# Patient Record
Sex: Male | Born: 1955 | Race: White | Hispanic: No | Marital: Married | Smoking: Former smoker
Health system: Southern US, Community
[De-identification: ages and names within clinical notes are randomized; demographics above are authoritative.]

## PROBLEM LIST (undated history)

## (undated) DIAGNOSIS — R51 Headache: Secondary | ICD-10-CM

## (undated) DIAGNOSIS — K219 Gastro-esophageal reflux disease without esophagitis: Secondary | ICD-10-CM

## (undated) DIAGNOSIS — T4145XA Adverse effect of unspecified anesthetic, initial encounter: Secondary | ICD-10-CM

## (undated) DIAGNOSIS — T8859XA Other complications of anesthesia, initial encounter: Secondary | ICD-10-CM

## (undated) DIAGNOSIS — I10 Essential (primary) hypertension: Secondary | ICD-10-CM

## (undated) HISTORY — PX: HAND / FINGER LESION EXCISION: SUR531

## (undated) HISTORY — PX: APPENDECTOMY: SHX54

## (undated) HISTORY — PX: ELBOW FRACTURE SURGERY: SHX616

## (undated) HISTORY — PX: HAND NERVE REPAIR: SHX1728

## (undated) HISTORY — PX: HERNIA REPAIR: SHX51

## (undated) HISTORY — PX: COLON SURGERY: SHX602

---
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
## 2002-02-27 ENCOUNTER — Encounter: Payer: Self-pay | Admitting: Family Medicine

## 2002-02-27 ENCOUNTER — Encounter: Admission: RE | Admit: 2002-02-27 | Discharge: 2002-02-27 | Payer: Self-pay | Admitting: Family Medicine

## 2002-03-31 ENCOUNTER — Ambulatory Visit (HOSPITAL_COMMUNITY): Admission: RE | Admit: 2002-03-31 | Discharge: 2002-03-31 | Payer: Self-pay | Admitting: *Deleted

## 2004-05-12 ENCOUNTER — Ambulatory Visit (HOSPITAL_COMMUNITY): Admission: RE | Admit: 2004-05-12 | Discharge: 2004-05-12 | Payer: Self-pay | Admitting: *Deleted

## 2004-12-16 ENCOUNTER — Emergency Department (HOSPITAL_COMMUNITY): Admission: EM | Admit: 2004-12-16 | Discharge: 2004-12-16 | Payer: Self-pay | Admitting: *Deleted

## 2008-08-05 ENCOUNTER — Observation Stay (HOSPITAL_COMMUNITY): Admission: EM | Admit: 2008-08-05 | Discharge: 2008-08-13 | Payer: Self-pay | Admitting: Emergency Medicine

## 2008-08-07 ENCOUNTER — Encounter (INDEPENDENT_AMBULATORY_CARE_PROVIDER_SITE_OTHER): Payer: Self-pay | Admitting: Surgery

## 2009-05-11 ENCOUNTER — Ambulatory Visit (HOSPITAL_COMMUNITY): Admission: RE | Admit: 2009-05-11 | Discharge: 2009-05-12 | Payer: Self-pay | Admitting: Surgery

## 2010-10-18 LAB — CULTURE, BLOOD (ROUTINE X 2)
Culture: NO GROWTH
Culture: NO GROWTH

## 2010-10-18 LAB — CBC
HCT: 37.3 % — ABNORMAL LOW (ref 39.0–52.0)
HCT: 37.6 % — ABNORMAL LOW (ref 39.0–52.0)
HCT: 47.9 % (ref 39.0–52.0)
Hemoglobin: 12.5 g/dL — ABNORMAL LOW (ref 13.0–17.0)
Hemoglobin: 13 g/dL (ref 13.0–17.0)
Hemoglobin: 14.6 g/dL (ref 13.0–17.0)
Hemoglobin: 16.9 g/dL (ref 13.0–17.0)
MCHC: 34.8 g/dL (ref 30.0–36.0)
MCHC: 35 g/dL (ref 30.0–36.0)
MCHC: 35.1 g/dL (ref 30.0–36.0)
MCHC: 35.2 g/dL (ref 30.0–36.0)
MCHC: 35.3 g/dL (ref 30.0–36.0)
MCHC: 35.5 g/dL (ref 30.0–36.0)
MCV: 91.5 fL (ref 78.0–100.0)
MCV: 92.1 fL (ref 78.0–100.0)
MCV: 92.3 fL (ref 78.0–100.0)
MCV: 93.2 fL (ref 78.0–100.0)
Platelets: 243 10*3/uL (ref 150–400)
Platelets: 263 10*3/uL (ref 150–400)
Platelets: 272 10*3/uL (ref 150–400)
Platelets: 290 10*3/uL (ref 150–400)
RBC: 4.01 MIL/uL — ABNORMAL LOW (ref 4.22–5.81)
RBC: 4.08 MIL/uL — ABNORMAL LOW (ref 4.22–5.81)
RBC: 4.53 MIL/uL (ref 4.22–5.81)
RBC: 4.59 MIL/uL (ref 4.22–5.81)
RBC: 5.19 MIL/uL (ref 4.22–5.81)
RDW: 12.8 % (ref 11.5–15.5)
RDW: 12.9 % (ref 11.5–15.5)
RDW: 13 % (ref 11.5–15.5)
RDW: 13.2 % (ref 11.5–15.5)
RDW: 13.2 % (ref 11.5–15.5)
RDW: 13.4 % (ref 11.5–15.5)
WBC: 7.6 10*3/uL (ref 4.0–10.5)

## 2010-10-18 LAB — URINALYSIS, ROUTINE W REFLEX MICROSCOPIC
Glucose, UA: NEGATIVE mg/dL
Glucose, UA: NEGATIVE mg/dL
Hgb urine dipstick: NEGATIVE
Ketones, ur: NEGATIVE mg/dL
Leukocytes, UA: NEGATIVE
Nitrite: NEGATIVE
Protein, ur: NEGATIVE mg/dL
Protein, ur: NEGATIVE mg/dL
Specific Gravity, Urine: 1.041 — ABNORMAL HIGH (ref 1.005–1.030)
pH: 7 (ref 5.0–8.0)

## 2010-10-18 LAB — DIFFERENTIAL
Basophils Absolute: 0 10*3/uL (ref 0.0–0.1)
Basophils Relative: 0 % (ref 0–1)
Basophils Relative: 0 % (ref 0–1)
Basophils Relative: 0 % (ref 0–1)
Eosinophils Absolute: 0 10*3/uL (ref 0.0–0.7)
Eosinophils Absolute: 0.3 10*3/uL (ref 0.0–0.7)
Eosinophils Absolute: 0.3 10*3/uL (ref 0.0–0.7)
Eosinophils Relative: 4 % (ref 0–5)
Lymphocytes Relative: 12 % (ref 12–46)
Lymphocytes Relative: 18 % (ref 12–46)
Lymphocytes Relative: 27 % (ref 12–46)
Lymphs Abs: 1.1 10*3/uL (ref 0.7–4.0)
Lymphs Abs: 1.4 10*3/uL (ref 0.7–4.0)
Lymphs Abs: 2.4 10*3/uL (ref 0.7–4.0)
Lymphs Abs: 2.7 10*3/uL (ref 0.7–4.0)
Monocytes Absolute: 0.7 10*3/uL (ref 0.1–1.0)
Monocytes Absolute: 1 10*3/uL (ref 0.1–1.0)
Monocytes Absolute: 1.2 10*3/uL — ABNORMAL HIGH (ref 0.1–1.0)
Monocytes Absolute: 1.5 10*3/uL — ABNORMAL HIGH (ref 0.1–1.0)
Monocytes Relative: 12 % (ref 3–12)
Monocytes Relative: 12 % (ref 3–12)
Monocytes Relative: 5 % (ref 3–12)
Monocytes Relative: 9 % (ref 3–12)
Neutro Abs: 13.2 10*3/uL — ABNORMAL HIGH (ref 1.7–7.7)
Neutro Abs: 14.6 10*3/uL — ABNORMAL HIGH (ref 1.7–7.7)
Neutro Abs: 5.6 10*3/uL (ref 1.7–7.7)
Neutro Abs: 7.3 10*3/uL (ref 1.7–7.7)
Neutrophils Relative %: 58 % (ref 43–77)
Neutrophils Relative %: 66 % (ref 43–77)
Neutrophils Relative %: 75 % (ref 43–77)

## 2010-10-18 LAB — HEPATIC FUNCTION PANEL
AST: 19 U/L (ref 0–37)
Bilirubin, Direct: 0.2 mg/dL (ref 0.0–0.3)
Total Bilirubin: 1.2 mg/dL (ref 0.3–1.2)

## 2010-10-18 LAB — BASIC METABOLIC PANEL
BUN: 10 mg/dL (ref 6–23)
BUN: 7 mg/dL (ref 6–23)
BUN: 9 mg/dL (ref 6–23)
BUN: 9 mg/dL (ref 6–23)
CO2: 24 mEq/L (ref 19–32)
CO2: 26 mEq/L (ref 19–32)
CO2: 28 mEq/L (ref 19–32)
CO2: 28 mEq/L (ref 19–32)
Calcium: 8.4 mg/dL (ref 8.4–10.5)
Calcium: 8.5 mg/dL (ref 8.4–10.5)
Calcium: 8.6 mg/dL (ref 8.4–10.5)
Chloride: 104 mEq/L (ref 96–112)
Chloride: 106 mEq/L (ref 96–112)
Chloride: 107 mEq/L (ref 96–112)
Creatinine, Ser: 1.04 mg/dL (ref 0.4–1.5)
Creatinine, Ser: 1.06 mg/dL (ref 0.4–1.5)
Creatinine, Ser: 1.26 mg/dL (ref 0.4–1.5)
GFR calc Af Amer: >60 mL/min (ref >60)
GFR calc Af Amer: >60 mL/min (ref >60)
GFR calc Af Amer: >60 mL/min (ref >60)
GFR calc non Af Amer: >60 mL/min (ref >60)
GFR calc non Af Amer: >60 mL/min (ref >60)
GFR calc non Af Amer: >60 mL/min (ref >60)
Glucose, Bld: 130 mg/dL — ABNORMAL HIGH (ref 70–99)
Glucose, Bld: 132 mg/dL — ABNORMAL HIGH (ref 70–99)
Glucose, Bld: 91 mg/dL (ref 70–99)
Glucose, Bld: 98 mg/dL (ref 70–99)
Glucose, Bld: 99 mg/dL (ref 70–99)
Potassium: 3.7 mEq/L (ref 3.5–5.1)
Potassium: 3.7 mEq/L (ref 3.5–5.1)
Sodium: 136 mEq/L (ref 135–145)
Sodium: 137 mEq/L (ref 135–145)
Sodium: 142 mEq/L (ref 135–145)

## 2010-10-18 LAB — COMPREHENSIVE METABOLIC PANEL
ALT: 18 U/L (ref 0–53)
ALT: 29 U/L (ref 0–53)
ALT: 35 U/L (ref 0–53)
AST: 28 U/L (ref 0–37)
Albumin: 3.1 g/dL — ABNORMAL LOW (ref 3.5–5.2)
Alkaline Phosphatase: 44 U/L (ref 39–117)
BUN: 10 mg/dL (ref 6–23)
CO2: 25 mEq/L (ref 19–32)
CO2: 28 mEq/L (ref 19–32)
Calcium: 8.1 mg/dL — ABNORMAL LOW (ref 8.4–10.5)
Calcium: 9.1 mg/dL (ref 8.4–10.5)
Calcium: 9.5 mg/dL (ref 8.4–10.5)
Creatinine, Ser: 1.22 mg/dL (ref 0.4–1.5)
GFR calc Af Amer: >60 mL/min (ref >60)
GFR calc Af Amer: >60 mL/min (ref >60)
GFR calc non Af Amer: >60 mL/min (ref >60)
Glucose, Bld: 102 mg/dL — ABNORMAL HIGH (ref 70–99)
Potassium: 3.9 mEq/L (ref 3.5–5.1)
Potassium: 4.2 mEq/L (ref 3.5–5.1)
Sodium: 136 mEq/L (ref 135–145)
Sodium: 143 mEq/L (ref 135–145)
Total Protein: 5.4 g/dL — ABNORMAL LOW (ref 6.0–8.3)
Total Protein: 6 g/dL (ref 6.0–8.3)
Total Protein: 7.1 g/dL (ref 6.0–8.3)

## 2010-10-18 LAB — STOOL CULTURE

## 2010-10-18 LAB — URINE CULTURE
Colony Count: NO GROWTH
Colony Count: NO GROWTH
Culture: NO GROWTH

## 2010-10-18 LAB — APTT: aPTT: 31 seconds (ref 24–37)

## 2010-10-18 LAB — HEMOCCULT GUIAC POC 1CARD (OFFICE): Fecal Occult Bld: NEGATIVE

## 2010-10-18 LAB — PROTIME-INR
INR: 1 (ref 0.00–1.49)
Prothrombin Time: 13.6 seconds (ref 11.6–15.2)
Prothrombin Time: 14.2 seconds (ref 11.6–15.2)

## 2010-10-18 LAB — MAGNESIUM: Magnesium: 2 mg/dL (ref 1.5–2.5)

## 2010-10-18 LAB — VITAMIN B12: Vitamin B-12: 510 pg/mL (ref 211–911)

## 2010-10-18 LAB — FECAL LACTOFERRIN, QUANT

## 2010-10-18 LAB — URINE MICROSCOPIC-ADD ON

## 2010-11-15 NOTE — H&P (Signed)
NAME:  Ruben Lopez, KUENZI NO.:  [id number]   MEDICAL RECORD NO.:  [id number]          PATIENT TYPE:  EMS   LOCATION:  MAJO                         FACILITY:  [facility]   PHYSICIAN:  Michiel Cowboy, MDDATE OF BIRTH:  08-01-1955   DATE OF ADMISSION:  08/05/2008  DATE OF DISCHARGE:                              HISTORY & PHYSICAL   PRIMARY CARE [place]:  Dr. Dorothe Pea.   CHIEF COMPLAINT:  Abdominal distention and pain, constipation and now  some diarrhea.  The patient is a 55 year old gentleman with a history of  gout and fracture of his ankle in the past.  Otherwise unremarkable.  He  had an episode of mild nausea and decreased appetite for about a week or  so with one episode of diarrhea and then followed by about 7 days of  constipation.  He had not used the bathroom at all for about 4 days and  then after use of some MiraLax and enema finally had some bowel  movements and even today diarrhea x1.  The patient continued to have  abdominal discomfort and presented to charge and care clinic where they  obtained a KUB and there was concern about possible small bowel  obstruction.  Preliminary read no, official urology read was done.  The  patient was sent as a direct admit care to [facility]. He has no history  of small bowel obstructions in the past. In his family his wife and his  son and have had similar bouts of diarrhea, nausea and vomiting, which  he had probably the least.  He had no abdominal surgeries in the past.  The patient had been passing diarrhea today and had a lot of rumbling in  his abdomen.   PAST MEDICAL HISTORY:  Otherwise unremarkable.  Of note, the patient had  a negative colonoscopy 2 years ago and also has had in the past  esophageal dilatation but currently not endorsing any history of  dysphasia.   SOCIAL HISTORY:  Never smoked, except for when he was a teenager. Does  not drink alcohol except occasionally.  Lives at home, has a wife and  kids.   FAMILY HISTORY:  Noncontributory.   ALLERGIES:  No known drug allergies.   MEDICATIONS:  Allopurinol 300 mg daily.  Has not taken that for a few  days now.   PHYSICAL EXAMINATION:  VITAL SIGNS: Temperature 98.7, blood pressure  168/100, pulse 98, respirations 18, satting 97% on room air.  The  patient appears to be in no acute distress.  HEENT:  Head nontraumatic.  Somewhat dryish mucous membranes but normal  skin tone.  LUNGS:  Clear to auscultation bilaterally.  Heart: Regular rate and rhythm.  No murmurs, rubs, gallops.  ABDOMEN: Slight epigastric tenderness, soft.  There are positive bowel  sounds. Round. There is slight generalized tenderness but otherwise  unremarkable.  LOWER EXTREMITIES: Without clubbing, cyanosis or edema.   REVIEW OF SYSTEMS:  Negative except for his HPI.  No fevers or chills.  No chest pain. No shortness of breath.  No blood in stool.   STUDIES:  No blood work  obtained, but a KUB when reviewed by radiologist  they felt there was nonspecific pattern. I did not think there was any  small bowel obstruction but recommended repeating imaging in the a.m.  and meanwhile observe the patient clinically.   ASSESSMENT/PLAN:  This is a 55 year old gentleman with constipation,  diarrhea, abdominal tenderness, question mild ileus but no evidence of  small bowel obstruction now.   1. Abdominal pain.  The patient had a recent gastroenteritis for which      he is now recovering. He had a bout of constipation after his      gastroenteritis which he self-treated at home and reports today he      had one evidence of episode of diarrhea. He is still having      somewhat diminished appetite and abdominal tenderness and pain.      Will observe overnight.  Check basic labs, CBC, CMP, check lipase.      Since he has some epigastric tenderness we will make sure he is on      Protonix. Since the patient actually had diarrhea will hold off on      cathartics at this  point and just see which way the patient trends      and how many bowel movements he will continue to have. Will repeat      KUB in the morning and follow him clinically. If diarrhea persists,      will send it for stool studies.  Give mild IV fluids for mild      dehydration.  2. History of gout.  Hold allopurinol for right now. We will give an      chips and bowel rest.  3. Prophylaxis. Protonix plus Lovenox.      Michiel Cowboy, MD  Electronically Signed     AVD/MEDQ  D:  08/05/2008  T:  08/06/2008  Job:  [id number]   cc:   Jethro Bastos, M.D.

## 2010-11-15 NOTE — Op Note (Signed)
NAME:  SAED, HUDLOW NO.:  [id number]   MEDICAL RECORD NO.:  [id number]          PATIENT TYPE:  OBV   LOCATION:  5123                         FACILITY:  [facility]   PHYSICIAN:  Thornton Park. Daphine Deutscher, MD  DATE OF BIRTH:  06-Dec-1955   DATE OF PROCEDURE:  08/07/2008  DATE OF DISCHARGE:                               OPERATIVE REPORT   PREOPERATIVE DIAGNOSIS:  A 55 year old man consulted this morning with  acute abdomen with exquisite right-sided abdominal pain and a CT scan  showing evidence of a cecal bascule.   PROCEDURE:  Exploratory laparotomy, decompression of the cecum through  the appendix with appendectomy, and cecopexy.   SURGEON:  Thornton Park. Daphine Deutscher, MD   ASSISTANT:  Letha Cape, PA   DESCRIPTION OF PROCEDURE:  Mr. Cain is a 55 year old man who was taken  to OR #17 on Friday, August 07, 2008, and given general anesthesia.  The abdomen was prepped with a Techni-Care substitute and draped  sterilely.  Longitudinal small incision was initially made and had  enlarged because of the massive distention.  Upon making a laparotomy  incision going to the right of the umbilicus, I found the cecum to the  midline and was massively enlarged.  In order to identify the anatomy, I  went ahead and found the terminal ileum and ran distally to where it  came into the cecum.  Then I made a realize that the cecum was twisted,  rotated up, and was likely obstructed near the midportion of the  ascending colon partially that was quite distended.  I elected to go  ahead and mobilize the right colon more and was able then to free the  appendix from the cecum.  I entered it using the appendix as a conduit  to get into the cecum and inserted a 16-gauge Angiocath through which I  was able to decompress the gaseous distention of the cecum and  transverse colon.  I then stapled across the appendix doing an  appendectomy and sending that specimen for permanent sections.  With the  cecum markedly deflated and unmanageable, I was then able to pex it in  the right lower quadrant without difficulty with a 3-0 Vicryl.  In  addition, the appendectomy stump was oriented to the peritoneal surface  laterally inferiorly in the abdominal cavity.  This seemed to straighten  out the cecum.  Bleeding was basically controlled.  I had used a  harmonic scalpel when mobilizing portion of the hepatic flexure and  trying to straighten out the ascending colon.  This was inspected.  No  other bleeding was noted.  The midline was closed with running double-  stranded PDS from either end and tied the middle.  The wound was  irrigated and closed with staples as well as some nylon sutures around  the umbilicus.  The patient seemed to tolerate the procedure and was  taken to recovery room in satisfactory condition.      Thornton Park Daphine Deutscher, MD  Electronically Signed     MBM/MEDQ  D:  08/07/2008  T:  08/08/2008  Job:  [id number]

## 2010-11-15 NOTE — Discharge Summary (Signed)
NAME:  Ruben Lopez, Ruben Lopez              ACCOUNT NO.:  [id number]   MEDICAL RECORD NO.:  [id number]          PATIENT TYPE:  OBV   LOCATION:  5153                         FACILITY:  [facility]   PHYSICIAN:  Kela Millin, M.D.DATE OF BIRTH:  11-Aug-1955   DATE OF ADMISSION:  08/05/2008  DATE OF DISCHARGE:  08/13/2008                               DISCHARGE SUMMARY   DISCHARGE DIAGNOSES:  1. Cecal volvulus, partial.  2. Volume depletion - resolved.  3. Hypokalemia - potassium replaced.  4. History of gout.   PROCEDURES AND STUDIES:  1. CT scan of abdomen and pelvis on August 06, 2008 - dilated,      abnormally positioned cecum which is more anterior and extending to      the mid line.  Slight twisting of the right lower quadrant      mesentery and terminal ileum without associated bowel obstruction -      cecal bascule or nonobstructing partial cecal volvulus.  2. Exploratory laparotomy - with decompression of the cecum via      appendix, appendectomy, and cecopexy per Dr. Daphine Deutscher.   CONSULTATIONS:  [facility].   BRIEF HISTORY:  The patient is a 55 year old white male with the above-  listed medical problems who presented with abdominal pain.  Had some  constipation, and then began having some diarrhea.  He reported that he  had had mild nausea and a decreased appetite for about a week or so, and  had 1 episode of diarrhea followed by 7 days of constipation.  He  reported that he had not had any bowel movement at all for 4 days, but  after he used MiraLax and an enema he began having bowel movements and  subsequently diarrhea.  He continued to have abdominal discomfort, and  so he went to the clinic where a KUB was obtained and there was concern  about possible small bowel obstruction.  He was directly admitted to the  hospital for further evaluation and management.   Please see the full admission history and physical dictated on August 05, 2008 by Dr. Adela Glimpse for the details of  the admission physical exam as  well as the laboratory data.   HOSPITAL COURSE:  1. Cecal volvulus, partial - upon admission the patient had abdominal      x-rays done which revealed no acute findings - nonobstructive bowel      gas pattern.  He received enemas, and by the next hospital day he      reported that he still was feeling constipated with some abdominal      discomfort.  A CT scan of his abdomen and pelvis was ordered, and      the results as stated above.  The patient was found to have an      acute abdomen, and [facility] was consulted and they saw the      patient and had exploratory laparotomy on August 07, 2008 with      decompression of the cecum via the appendix.  He also had an      appendectomy and a cecopexy.  The patient had leukocytosis, and      workup was, otherwise, negative for infectious source.  He was      empirically placed on IV antibiotics.  Following the surgery he      gradually began to improve, and was started on clear liquids which      he tolerated well.  He was mobilized and began having bowel      movements, and his diet was advanced to solids which he has      tolerated well.  Surgery subsequently discontinued the antibiotics.      The patient has continued to improve, and leukocytosis has      resolved.  He was seen on rounds by surgery today, and they      recommend that he be discharged home and they agree.  He is to      follow up with surgery on Tuesday for staple removal, and then see      Dr. Daphine Deutscher 2 weeks after the staples have been removed.  He is also      to follow up with his primary care physician following discharge in      about 2 weeks.  2. History of gout - the patient is to continue his allopurinol upon      discharge.  3. Hypokalemia - his potassium was replaced during his hospital stay.   DISCHARGE MEDICATIONS:  1. Percocet 5/325 mg 1-2 tablets p.o. q.4 hours p.r.n. pain.  2. Allopurinol 300 mg p.o. daily as  previously.  3. Stool softener p.r.n. constipation.  4. Zyrtec-D 1 p.o. daily as previously.   FOLLOW-UP CARE:  1. To [facility] on August 18, 2008 at 3:30 p.m. for staple      removal.  2. Dr. Dorothe Pea in 1-2 weeks.  3. Dr. Daphine Deutscher 2 weeks after staple removal as above.   DISCHARGE CONDITION:  Improved/stable.      Kela Millin, M.D.  Electronically Signed     ACV/MEDQ  D:  08/13/2008  T:  08/13/2008  Job:  [id number]   cc:   Jethro Bastos, M.D.  Daphine Deutscher, M.D.

## 2010-11-15 NOTE — Consult Note (Signed)
NAME:  Ruben Lopez, SHILLINGFORD NO.:  [id number]   MEDICAL RECORD NO.:  [id number]          PATIENT TYPE:  OBV   LOCATION:  5153                         FACILITY:  [facility]   PHYSICIAN:  Thornton Park. Daphine Deutscher, MD  DATE OF BIRTH:  1956/01/07   DATE OF CONSULTATION:  08/07/2008  DATE OF DISCHARGE:                                 CONSULTATION   TIME OF CONSULTATION:  0905 a.m.   REQUESTING PHYSICIAN:  Ramiro Harvest, MD, [facility].   CONSULTING SURGEON:  Thornton Park. Daphine Deutscher, MD.   REASON FOR CONSULTATION:  Abdominal pain, question cecal volvulus.   HISTORY OF PRESENT ILLNESS:  [name] is a 55 year old, otherwise,  healthy male who had apparently several days of diarrhea at home and was  admitted to the hospital due to his diarrhea.  At this time, they felt  that he had gastroenteritis.  However, once admitted the patient was  complaining of not having adequate bowel movements and so at this time  he was felt to be constipated and lactulose as well as enemas were  given.  Apparently, the patient was improving until last night the  patient had an acute onset of severe abdominal pain at approximately  0200 a.m.  A CT scan of the abdomen and pelvis was done, which showed  questionable partial cecal volvulus with twisting of the right lower  quadrant mesentery.  At this time, the patient has no longer passed any  flatus, had any bowel movement and has had several waves of nausea.  At  this time, he is complaining of severe abdominal pain with distention.  At this time, we were consulted due to an acute abdomen.   REVIEW OF SYSTEMS:  Please see HPI, otherwise, currently all other  systems are negative.   PAST MEDICAL HISTORY:  Gout.   PAST SURGICAL HISTORY:  No abdominal surgeries.   SOCIAL HISTORY:  The patient is married.  Currently, his sister, wife,  and father are in the room.  He does not smoke tobacco or drink alcohol.   ALLERGIES:  NKDA.   MEDICATIONS:   At home include,  1. Allopurinol 300 mg daily.  2. Zyrtec-D 1 tablet daily.   PHYSICAL EXAMINATION:  GENERAL:  Ruben Lopez is a 55 year old well-  developed, well-nourished white male who was currently lying in bed, in  severe distress.  VITAL SIGNS:  Temperature 98.1, blood pressure 162/82, pulse 64, and  respirations 18.  HEENT:  Head is normocephalic, atraumatic.  Sclerae noninjected.  Pupils  are equal, round, and reactive to light.  Ears and nose without any  obvious masses or lesions.  No rhinorrhea.  Mouth is pink and moist.  Throat shows no exudate.  NECK:  Supple.  Trachea is midline.  No thyromegaly.  HEART:  Regular rate and rhythm.  Normal S1 and S2.  No murmurs,  gallops, or rubs are noted.  A +2 carotid, radial, and pedal pulses  bilaterally.  LUNGS:  Clear to auscultation bilaterally.  No wheezes, rhonchi, or  rales noted.  Respiratory effort is nonlabored.  ABDOMEN:  Acute.  He is  very distended and tight.  He does have positive  peritoneal signs with extreme tenderness even with just the use of my  stethoscope.  At this time, he does not have any bowel sounds.  He does  not have any rebounding.  He does not have any scars, masses, or hernias  noted.  MUSCULOSKELETAL:  All 4 extremities are symmetrical with no cyanosis,  clubbing, or edema.  NEUROLOGIC:  Cranial nerves II through XII appeared to be grossly  intact.  PSYCH: The patient is alert and oriented x3 with an appropriate affect.   LABORATORIES AND DIAGNOSTICS:  White blood cell count is 14,600, which  is up from 7900, hemoglobin 14.6, hematocrit 41.4, platelet count is  296,000 with 90% neutrophil count.  PT is 1.1, lactic acid is 1.1.  Sodium 139, potassium 3.7, BUN 9, and creatinine 1.16.  CT scan of the  abdomen and pelvis revealed dilated abnormally positioned cecum, which  is more anterior and extending to the midline with slight twisting of  the right lower quadrant mesentery and terminal ileum without  associated  bowel obstruction.  Differential includes cecal bascule or  nonobstructing partial cecal volvulus.   IMPRESSION:  1. Acute abdomen, possible cecal volvulus.  2. History of gout.  3. Leukocytosis.   PLAN:  At this time, the patient needs emergent surgery.  Therefore, the  patient will be continued on n.p.o. and Unasyn will be started.  Consent  has been obtained.  Risks and benefits of the surgery have been  explained to the patient as well as the family and all are in agreement  with proceeding with an exploratory laparotomy.  Dr. Daphine Deutscher has assessed  the patient and agrees with the plan.      Letha Cape, PA      Thornton Park Daphine Deutscher, MD  Electronically Signed    KEO/MEDQ  D:  08/07/2008  T:  08/07/2008  Job:  [id number]   cc:   Ramiro Harvest, MD

## 2010-11-30 ENCOUNTER — Ambulatory Visit: Payer: 59 | Attending: Family Medicine

## 2010-11-30 DIAGNOSIS — IMO0001 Reserved for inherently not codable concepts without codable children: Secondary | ICD-10-CM | POA: Insufficient documentation

## 2010-11-30 DIAGNOSIS — M546 Pain in thoracic spine: Secondary | ICD-10-CM | POA: Insufficient documentation

## 2010-11-30 DIAGNOSIS — R293 Abnormal posture: Secondary | ICD-10-CM | POA: Insufficient documentation

## 2010-11-30 DIAGNOSIS — M542 Cervicalgia: Secondary | ICD-10-CM | POA: Insufficient documentation

## 2010-12-05 ENCOUNTER — Ambulatory Visit: Payer: 59 | Attending: Family Medicine

## 2010-12-05 DIAGNOSIS — M542 Cervicalgia: Secondary | ICD-10-CM | POA: Insufficient documentation

## 2010-12-05 DIAGNOSIS — IMO0001 Reserved for inherently not codable concepts without codable children: Secondary | ICD-10-CM | POA: Insufficient documentation

## 2010-12-05 DIAGNOSIS — R293 Abnormal posture: Secondary | ICD-10-CM | POA: Insufficient documentation

## 2010-12-05 DIAGNOSIS — M546 Pain in thoracic spine: Secondary | ICD-10-CM | POA: Insufficient documentation

## 2011-06-06 ENCOUNTER — Encounter (HOSPITAL_COMMUNITY): Payer: Self-pay | Admitting: Gastroenterology

## 2011-06-06 ENCOUNTER — Other Ambulatory Visit: Payer: Self-pay | Admitting: Gastroenterology

## 2011-06-06 ENCOUNTER — Ambulatory Visit (HOSPITAL_COMMUNITY)
Admission: RE | Admit: 2011-06-06 | Discharge: 2011-06-06 | Disposition: A | Discharge status: Home / Self Care | Payer: 59 | Source: Ambulatory Visit | Attending: Gastroenterology | Admitting: Gastroenterology

## 2011-06-06 ENCOUNTER — Encounter (HOSPITAL_COMMUNITY)
Admission: RE | Disposition: A | Discharge status: Home / Self Care | Payer: Self-pay | Source: Ambulatory Visit | Attending: Gastroenterology

## 2011-06-06 DIAGNOSIS — M109 Gout, unspecified: Secondary | ICD-10-CM | POA: Insufficient documentation

## 2011-06-06 DIAGNOSIS — K222 Esophageal obstruction: Secondary | ICD-10-CM | POA: Insufficient documentation

## 2011-06-06 DIAGNOSIS — K219 Gastro-esophageal reflux disease without esophagitis: Secondary | ICD-10-CM | POA: Insufficient documentation

## 2011-06-06 DIAGNOSIS — R1310 Dysphagia, unspecified: Secondary | ICD-10-CM | POA: Insufficient documentation

## 2011-06-06 HISTORY — PX: BALLOON DILATION: SHX5330

## 2011-06-06 HISTORY — PX: ESOPHAGOGASTRODUODENOSCOPY: SHX5428

## 2011-06-06 HISTORY — DX: Headache: R51

## 2011-06-06 HISTORY — DX: Gastro-esophageal reflux disease without esophagitis: K21.9

## 2011-06-06 SURGERY — EGD (ESOPHAGOGASTRODUODENOSCOPY)
Anesthesia: Moderate Sedation

## 2011-06-06 MED ORDER — SODIUM CHLORIDE 0.9 % IV SOLN
Freq: Once | INTRAVENOUS | Status: AC
  Administered 2011-06-06: 500 mL via INTRAVENOUS

## 2011-06-06 MED ORDER — MIDAZOLAM HCL 10 MG/2ML IJ SOLN
INTRAMUSCULAR | Status: AC
  Filled 2011-06-06: qty 2

## 2011-06-06 MED ORDER — DIPHENHYDRAMINE HCL 50 MG/ML IJ SOLN
INTRAMUSCULAR | Status: AC
  Filled 2011-06-06: qty 1

## 2011-06-06 MED ORDER — FENTANYL CITRATE 0.05 MG/ML IJ SOLN
INTRAMUSCULAR | Status: AC
  Filled 2011-06-06: qty 2

## 2011-06-06 MED ORDER — BUTAMBEN-TETRACAINE-BENZOCAINE 2-2-14 % EX AERO
INHALATION_SPRAY | CUTANEOUS | Status: DC | PRN
  Administered 2011-06-06: 2 via TOPICAL

## 2011-06-06 MED ORDER — DIPHENHYDRAMINE HCL 50 MG/ML IJ SOLN
INTRAMUSCULAR | Status: DC | PRN
  Administered 2011-06-06: 25 mg via INTRAVENOUS

## 2011-06-06 MED ORDER — MIDAZOLAM HCL 10 MG/2ML IJ SOLN
INTRAMUSCULAR | Status: DC | PRN
  Administered 2011-06-06 (×3): 2 mg via INTRAVENOUS
  Administered 2011-06-06: 1 mg via INTRAVENOUS

## 2011-06-06 MED ORDER — FENTANYL NICU IV SYRINGE 50 MCG/ML
INJECTION | INTRAMUSCULAR | Status: DC | PRN
  Administered 2011-06-06 (×2): 25 ug via INTRAVENOUS

## 2011-06-06 NOTE — H&P (Signed)
  Patient seen and examined and evaluated in preop no new changes or complaints from office visit last week please see scanned report for details

## 2011-06-07 ENCOUNTER — Encounter (HOSPITAL_COMMUNITY): Payer: Self-pay

## 2011-06-07 ENCOUNTER — Encounter (HOSPITAL_COMMUNITY): Payer: Self-pay | Admitting: Gastroenterology

## 2013-09-11 ENCOUNTER — Ambulatory Visit
Admission: RE | Admit: 2013-09-11 | Discharge: 2013-09-11 | Disposition: A | Discharge status: Home / Self Care | Payer: 59 | Source: Ambulatory Visit | Attending: Gastroenterology | Admitting: Gastroenterology

## 2013-09-11 ENCOUNTER — Other Ambulatory Visit: Payer: Self-pay | Admitting: Gastroenterology

## 2013-09-11 DIAGNOSIS — R109 Unspecified abdominal pain: Secondary | ICD-10-CM

## 2018-08-26 ENCOUNTER — Other Ambulatory Visit: Payer: Self-pay | Admitting: Family Medicine

## 2018-08-26 DIAGNOSIS — M5410 Radiculopathy, site unspecified: Secondary | ICD-10-CM

## 2018-09-11 ENCOUNTER — Other Ambulatory Visit: Payer: Self-pay | Admitting: Orthopedic Surgery

## 2018-09-11 DIAGNOSIS — M5416 Radiculopathy, lumbar region: Secondary | ICD-10-CM

## 2018-09-19 ENCOUNTER — Other Ambulatory Visit: Payer: Self-pay

## 2018-10-22 ENCOUNTER — Other Ambulatory Visit: Payer: Self-pay

## 2018-11-19 ENCOUNTER — Ambulatory Visit
Admission: RE | Admit: 2018-11-19 | Discharge: 2018-11-19 | Disposition: A | Discharge status: Home / Self Care | Payer: 59 | Source: Ambulatory Visit | Attending: Orthopedic Surgery | Admitting: Orthopedic Surgery

## 2018-11-19 ENCOUNTER — Other Ambulatory Visit: Payer: Self-pay

## 2018-11-19 DIAGNOSIS — M5416 Radiculopathy, lumbar region: Secondary | ICD-10-CM

## 2019-05-13 ENCOUNTER — Other Ambulatory Visit: Payer: Self-pay | Admitting: Physical Medicine and Rehabilitation

## 2019-05-13 DIAGNOSIS — M5416 Radiculopathy, lumbar region: Secondary | ICD-10-CM

## 2019-05-26 ENCOUNTER — Ambulatory Visit
Admission: RE | Admit: 2019-05-26 | Discharge: 2019-05-26 | Disposition: A | Discharge status: Home / Self Care | Payer: 59 | Source: Ambulatory Visit | Attending: Physical Medicine and Rehabilitation | Admitting: Physical Medicine and Rehabilitation

## 2019-05-26 ENCOUNTER — Other Ambulatory Visit: Payer: Self-pay

## 2019-05-26 DIAGNOSIS — M5416 Radiculopathy, lumbar region: Secondary | ICD-10-CM

## 2019-05-26 MED ORDER — METHYLPREDNISOLONE ACETATE 40 MG/ML INJ SUSP (RADIOLOG
40.0000 mg | Freq: Once | INTRAMUSCULAR | Status: AC
Start: 2019-05-26 — End: 2019-05-26
  Administered 2019-05-26: 09:00:00 40 mg via INTRA_ARTICULAR

## 2019-06-20 ENCOUNTER — Other Ambulatory Visit: Payer: Self-pay | Admitting: Orthopedic Surgery

## 2019-06-25 NOTE — Progress Notes (Signed)
Attempted to contact patient to schedule pre-procedure COVID appointment, no answer, left message for patient to return call   

## 2019-07-07 ENCOUNTER — Other Ambulatory Visit (HOSPITAL_COMMUNITY)
Admission: RE | Admit: 2019-07-07 | Discharge: 2019-07-07 | Disposition: A | Discharge status: Home / Self Care | Payer: No Typology Code available for payment source | Source: Ambulatory Visit | Attending: Orthopedic Surgery | Admitting: Orthopedic Surgery

## 2019-07-07 DIAGNOSIS — Z01812 Encounter for preprocedural laboratory examination: Secondary | ICD-10-CM | POA: Insufficient documentation

## 2019-07-07 DIAGNOSIS — Z20822 Contact with and (suspected) exposure to covid-19: Secondary | ICD-10-CM | POA: Diagnosis not present

## 2019-07-07 NOTE — Progress Notes (Signed)
[id number] - [place], [place] [address] [place] [address] Phone: [phone] Fax: [phone]    Your procedure is scheduled on Thursday, January 7th.  Report to [facility] Main Entrance "A" at 5:30 A.M., and check in at the Admitting office.  Call this number if you have problems the morning of surgery:  [phone]  Call [phone] if you have any questions prior to your surgery date Monday-Friday 8am-4pm   Remember:  Do not eat after midnight the night before your surgery (Wednesday 07/09/2019)  You may drink clear liquids until 4:30 the morning of your surgery.   Clear liquids allowed are: Water, Non-Citrus Juices (without pulp), Carbonated Beverages, Clear Tea, Black Coffee Only, and Gatorade   Please complete your PRE-SURGERY ENSURE that was provided to you by 4:30 A.M. the morning of surgery. Please, if able, drink it in one setting. DO NOT SIP.   Take these medicines the morning of surgery with A SIP OF WATER   If needed - FLOVENT HFA/inhaler, traMADol (ULTRAM)   As of today, STOP taking any Aspirin (unless otherwise instructed by your surgeon), Aleve, Naproxen, Ibuprofen, Motrin, Advil, Goody's, BC's, all herbal medications, fish oil, and all vitamins.  The Morning of Surgery  Do not wear jewelry..  Do not wear lotions, powders, colognes, or deodorant  Do not shave 48 hours prior to surgery.  Men may shave face and neck.  Do not bring valuables to the hospital.  [facility] is not responsible for any belongings or valuables.  If you are a smoker, DO NOT Smoke 24 hours prior to surgery  If you wear a CPAP at night please bring your mask, tubing, and machine the morning of surgery   Remember that you must have someone to transport you home after your surgery, and remain with you for 24 hours if you are discharged the same day.  Please bring cases for contacts, glasses, hearing aids,  dentures or bridgework because it cannot be worn into surgery.   Leave your suitcase in the car.  After surgery it may be brought to your room.  For patients admitted to the hospital, discharge time will be determined by your treatment team.  Patients discharged the day of surgery will not be allowed to drive home.   Special instructions:   Ruben Lopez- Preparing For Surgery  Before surgery, you can play an important role. Because skin is not sterile, your skin needs to be as free of germs as possible. You can reduce the number of germs on your skin by washing with CHG (chlorahexidine gluconate) Soap before surgery.  CHG is an antiseptic cleaner which kills germs and bonds with the skin to continue killing germs even after washing.    Oral Hygiene is also important to reduce your risk of infection.  Remember - BRUSH YOUR TEETH THE MORNING OF SURGERY WITH YOUR REGULAR TOOTHPASTE  Please do not use if you have an allergy to CHG or antibacterial soaps. If your skin becomes reddened/irritated stop using the CHG.  Do not shave (including legs and underarms) for at least 48 hours prior to first CHG shower. It is OK to shave your face.  Please follow these instructions carefully.   1. Shower the NIGHT BEFORE SURGERY and the MORNING OF SURGERY with CHG Soap.   2. If you chose to wash your hair, wash your hair first as usual with your normal shampoo.  3. After you shampoo, rinse  your hair and body thoroughly to remove the shampoo.  4. Use CHG as you would any other liquid soap. You can apply CHG directly to the skin and wash gently with a scrungie or a clean washcloth.   5. Apply the CHG Soap to your body ONLY FROM THE NECK DOWN.  Do not use on open wounds or open sores. Avoid contact with your eyes, ears, mouth and genitals (private parts). Wash Face and genitals (private parts)  with your normal soap.   6. Wash thoroughly, paying special attention to the area where your surgery will be  performed.  7. Thoroughly rinse your body with warm water from the neck down.  8. DO NOT shower/wash with your normal soap after using and rinsing off the CHG Soap.  9. Pat yourself dry with a CLEAN TOWEL.  10. Wear CLEAN PAJAMAS to bed the night before surgery, wear comfortable clothes the morning of surgery  11. Place CLEAN SHEETS on your bed the night of your first shower and DO NOT SLEEP WITH PETS.  Day of Surgery: Please shower the morning of surgery with the CHG soap Do not apply any deodorants/lotions. Please wear clean clothes to the hospital/surgery center.   Remember to brush your teeth WITH YOUR REGULAR TOOTHPASTE.  Please read over the following fact sheets that you were given.

## 2019-07-08 ENCOUNTER — Other Ambulatory Visit: Payer: Self-pay

## 2019-07-08 ENCOUNTER — Encounter (HOSPITAL_COMMUNITY)
Admission: RE | Admit: 2019-07-08 | Discharge: 2019-07-08 | Disposition: A | Discharge status: Home / Self Care | Payer: No Typology Code available for payment source | Source: Ambulatory Visit | Attending: Orthopedic Surgery | Admitting: Orthopedic Surgery

## 2019-07-08 ENCOUNTER — Encounter (HOSPITAL_COMMUNITY): Payer: Self-pay

## 2019-07-08 DIAGNOSIS — Z01818 Encounter for other preprocedural examination: Secondary | ICD-10-CM | POA: Diagnosis present

## 2019-07-08 HISTORY — DX: Other complications of anesthesia, initial encounter: T88.59XA

## 2019-07-08 HISTORY — DX: Essential (primary) hypertension: I10

## 2019-07-08 LAB — CBC WITH DIFFERENTIAL/PLATELET
Abs Immature Granulocytes: 0.24 10*3/uL — ABNORMAL HIGH (ref 0.00–0.07)
Basophils Absolute: 0.1 10*3/uL (ref 0.0–0.1)
Basophils Relative: 2 %
Eosinophils Absolute: 0.3 10*3/uL (ref 0.0–0.5)
Eosinophils Relative: 4 %
HCT: 48.5 % (ref 39.0–52.0)
Hemoglobin: 17 g/dL (ref 13.0–17.0)
Immature Granulocytes: 3 %
Lymphocytes Relative: 27 %
Lymphs Abs: 2.2 10*3/uL (ref 0.7–4.0)
MCH: 32.3 pg (ref 26.0–34.0)
MCHC: 35.1 g/dL (ref 30.0–36.0)
MCV: 92.2 fL (ref 80.0–100.0)
Monocytes Absolute: 1.3 10*3/uL — ABNORMAL HIGH (ref 0.1–1.0)
Monocytes Relative: 16 %
Neutro Abs: 4.1 10*3/uL (ref 1.7–7.7)
Neutrophils Relative %: 48 %
Platelets: 304 10*3/uL (ref 150–400)
RBC: 5.26 MIL/uL (ref 4.22–5.81)
RDW: 12.8 % (ref 11.5–15.5)
WBC: 8.4 10*3/uL (ref 4.0–10.5)
nRBC: 0 % (ref 0.0–0.2)

## 2019-07-08 LAB — TYPE AND SCREEN
ABO/RH(D): O POS
Antibody Screen: NEGATIVE

## 2019-07-08 LAB — APTT: aPTT: 33 seconds (ref 24–36)

## 2019-07-08 LAB — URINALYSIS, ROUTINE W REFLEX MICROSCOPIC
Bilirubin Urine: NEGATIVE
Glucose, UA: NEGATIVE mg/dL
Hgb urine dipstick: NEGATIVE
Ketones, ur: NEGATIVE mg/dL
Leukocytes,Ua: NEGATIVE
Nitrite: NEGATIVE
Protein, ur: NEGATIVE mg/dL
Specific Gravity, Urine: 1.014 (ref 1.005–1.030)
pH: 7 (ref 5.0–8.0)

## 2019-07-08 LAB — COMPREHENSIVE METABOLIC PANEL
ALT: 17 U/L (ref 0–44)
AST: 21 U/L (ref 15–41)
Albumin: 4 g/dL (ref 3.5–5.0)
Alkaline Phosphatase: 50 U/L (ref 38–126)
Anion gap: 11 (ref 5–15)
BUN: 17 mg/dL (ref 8–23)
CO2: 26 mmol/L (ref 22–32)
Calcium: 9.2 mg/dL (ref 8.9–10.3)
Chloride: 99 mmol/L (ref 98–111)
Creatinine, Ser: 1.3 mg/dL — ABNORMAL HIGH (ref 0.61–1.24)
GFR calc Af Amer: >60 mL/min (ref >60)
GFR calc non Af Amer: 58 mL/min — ABNORMAL LOW (ref >60)
Glucose, Bld: 108 mg/dL — ABNORMAL HIGH (ref 70–99)
Potassium: 4.1 mmol/L (ref 3.5–5.1)
Sodium: 136 mmol/L (ref 135–145)
Total Bilirubin: 0.8 mg/dL (ref 0.3–1.2)
Total Protein: 6.9 g/dL (ref 6.5–8.1)

## 2019-07-08 LAB — NOVEL CORONAVIRUS, NAA (HOSP ORDER, SEND-OUT TO REF LAB; TAT 18-24 HRS): SARS-CoV-2, NAA: NOT DETECTED

## 2019-07-08 LAB — SURGICAL PCR SCREEN
MRSA, PCR: NEGATIVE
Staphylococcus aureus: NEGATIVE

## 2019-07-08 LAB — PROTIME-INR
INR: 0.9 (ref 0.8–1.2)
Prothrombin Time: 12.2 seconds (ref 11.4–15.2)

## 2019-07-08 LAB — ABO/RH: ABO/RH(D): O POS

## 2019-07-08 NOTE — Progress Notes (Signed)
PCP:  Darrow Bussing MD Cardiologist:  Denies  EKG:  07/08/19 CXR:  N/A ECHO:  denies Stress Test:  denies Cardiac Cath:  Denies  Covid 07/07/19  Patient denies shortness of breath, fever, cough, and chest pain at PAT appointment.  Patient verbalized understanding of instructions provided today at the PAT appointment.  Patient asked to review instructions at home and day of surgery.

## 2019-07-09 NOTE — Progress Notes (Addendum)
Patient called and made aware of time change and told to arrive at 0930 for his 1224 case. Patient insisted the office had just called him a couple of hours prior to this call and stated for him to arrive at 0830 because his "case would be at 1130 or before because he should have a flip room". Patient stated he would arrive at 0830.

## 2019-07-10 ENCOUNTER — Ambulatory Visit (HOSPITAL_COMMUNITY)
Admission: RE | Admit: 2019-07-10 | Discharge: 2019-07-10 | Disposition: A | Discharge status: Home / Self Care | Payer: No Typology Code available for payment source | Source: Ambulatory Visit | Attending: Orthopedic Surgery | Admitting: Orthopedic Surgery

## 2019-07-10 ENCOUNTER — Ambulatory Visit (HOSPITAL_COMMUNITY): Payer: No Typology Code available for payment source

## 2019-07-10 ENCOUNTER — Other Ambulatory Visit: Payer: Self-pay

## 2019-07-10 ENCOUNTER — Ambulatory Visit (HOSPITAL_COMMUNITY): Payer: No Typology Code available for payment source | Admitting: Anesthesiology

## 2019-07-10 ENCOUNTER — Encounter (HOSPITAL_COMMUNITY): Payer: Self-pay | Admitting: Orthopedic Surgery

## 2019-07-10 ENCOUNTER — Ambulatory Visit (HOSPITAL_COMMUNITY)
Admission: RE | Disposition: A | Discharge status: Home / Self Care | Payer: Self-pay | Source: Ambulatory Visit | Attending: Orthopedic Surgery

## 2019-07-10 DIAGNOSIS — K219 Gastro-esophageal reflux disease without esophagitis: Secondary | ICD-10-CM | POA: Insufficient documentation

## 2019-07-10 DIAGNOSIS — Z87891 Personal history of nicotine dependence: Secondary | ICD-10-CM | POA: Diagnosis not present

## 2019-07-10 DIAGNOSIS — I10 Essential (primary) hypertension: Secondary | ICD-10-CM | POA: Diagnosis not present

## 2019-07-10 DIAGNOSIS — M5418 Radiculopathy, sacral and sacrococcygeal region: Secondary | ICD-10-CM | POA: Insufficient documentation

## 2019-07-10 DIAGNOSIS — Z79899 Other long term (current) drug therapy: Secondary | ICD-10-CM | POA: Insufficient documentation

## 2019-07-10 DIAGNOSIS — Z419 Encounter for procedure for purposes other than remedying health state, unspecified: Secondary | ICD-10-CM

## 2019-07-10 DIAGNOSIS — M4808 Spinal stenosis, sacral and sacrococcygeal region: Secondary | ICD-10-CM | POA: Diagnosis not present

## 2019-07-10 DIAGNOSIS — M5387 Other specified dorsopathies, lumbosacral region: Secondary | ICD-10-CM | POA: Diagnosis not present

## 2019-07-10 HISTORY — PX: LUMBAR LAMINECTOMY/DECOMPRESSION MICRODISCECTOMY: SHX5026

## 2019-07-10 SURGERY — LUMBAR LAMINECTOMY/DECOMPRESSION MICRODISCECTOMY
Anesthesia: General

## 2019-07-10 MED ORDER — ONDANSETRON HCL 4 MG/2ML IJ SOLN
INTRAMUSCULAR | Status: AC
  Filled 2019-07-10: qty 2

## 2019-07-10 MED ORDER — LACTATED RINGERS IV SOLN: INTRAVENOUS | Status: DC

## 2019-07-10 MED ORDER — 0.9 % SODIUM CHLORIDE (POUR BTL) OPTIME
TOPICAL | Status: DC | PRN
  Administered 2019-07-10: 1000 mL

## 2019-07-10 MED ORDER — MIDAZOLAM HCL 5 MG/5ML IJ SOLN
INTRAMUSCULAR | Status: DC | PRN
  Administered 2019-07-10: 2 mg via INTRAVENOUS

## 2019-07-10 MED ORDER — BUPIVACAINE LIPOSOME 1.3 % IJ SUSP
20.0000 mL | INTRAMUSCULAR | Status: DC
  Filled 2019-07-10: qty 20

## 2019-07-10 MED ORDER — BUPIVACAINE HCL (PF) 0.25 % IJ SOLN
INTRAMUSCULAR | Status: DC | PRN
  Administered 2019-07-10: 27 mL

## 2019-07-10 MED ORDER — OXYCODONE HCL 5 MG PO TABS
5.0000 mg | ORAL_TABLET | Freq: Once | ORAL | Status: AC | PRN
  Administered 2019-07-10: 5 mg via ORAL

## 2019-07-10 MED ORDER — DEXAMETHASONE SODIUM PHOSPHATE 10 MG/ML IJ SOLN
INTRAMUSCULAR | Status: AC
  Filled 2019-07-10: qty 1

## 2019-07-10 MED ORDER — FENTANYL CITRATE (PF) 100 MCG/2ML IJ SOLN
INTRAMUSCULAR | Status: DC | PRN
  Administered 2019-07-10 (×3): 50 ug via INTRAVENOUS
  Administered 2019-07-10: 100 ug via INTRAVENOUS

## 2019-07-10 MED ORDER — SUGAMMADEX SODIUM 200 MG/2ML IV SOLN
INTRAVENOUS | Status: DC | PRN
  Administered 2019-07-10: 170 mg via INTRAVENOUS

## 2019-07-10 MED ORDER — LIDOCAINE 2% (20 MG/ML) 5 ML SYRINGE
INTRAMUSCULAR | Status: AC
  Filled 2019-07-10: qty 5

## 2019-07-10 MED ORDER — MIDAZOLAM HCL 2 MG/2ML IJ SOLN
INTRAMUSCULAR | Status: AC
  Filled 2019-07-10: qty 2

## 2019-07-10 MED ORDER — EPINEPHRINE PF 1 MG/ML IJ SOLN
INTRAMUSCULAR | Status: AC
  Filled 2019-07-10: qty 1

## 2019-07-10 MED ORDER — FENTANYL CITRATE (PF) 250 MCG/5ML IJ SOLN
INTRAMUSCULAR | Status: AC
  Filled 2019-07-10: qty 5

## 2019-07-10 MED ORDER — BUPIVACAINE LIPOSOME 1.3 % IJ SUSP
INTRAMUSCULAR | Status: DC | PRN
  Administered 2019-07-10: 20 mL

## 2019-07-10 MED ORDER — METHOCARBAMOL 500 MG PO TABS: 500.0000 mg | ORAL_TABLET | Freq: Four times a day (QID) | ORAL | 1 refills | Status: AC | PRN

## 2019-07-10 MED ORDER — BUPIVACAINE HCL (PF) 0.25 % IJ SOLN
INTRAMUSCULAR | Status: AC
  Filled 2019-07-10: qty 30

## 2019-07-10 MED ORDER — HEMOSTATIC AGENTS (NO CHARGE) OPTIME
TOPICAL | Status: DC | PRN
  Administered 2019-07-10: 1 via TOPICAL

## 2019-07-10 MED ORDER — THROMBIN 20000 UNITS EX KIT
PACK | CUTANEOUS | Status: AC
  Filled 2019-07-10: qty 1

## 2019-07-10 MED ORDER — METHYLENE BLUE 0.5 % INJ SOLN
INTRAVENOUS | Status: DC | PRN
  Administered 2019-07-10: 1 mL via SUBMUCOSAL

## 2019-07-10 MED ORDER — EPINEPHRINE PF 1 MG/ML IJ SOLN
INTRAMUSCULAR | Status: DC | PRN
  Administered 2019-07-10: .15 mL

## 2019-07-10 MED ORDER — PROPOFOL 10 MG/ML IV BOLUS
INTRAVENOUS | Status: DC | PRN
  Administered 2019-07-10: 170 mg via INTRAVENOUS

## 2019-07-10 MED ORDER — PHENYLEPHRINE HCL-NACL 10-0.9 MG/250ML-% IV SOLN
INTRAVENOUS | Status: DC | PRN
  Administered 2019-07-10: 25 ug/min via INTRAVENOUS

## 2019-07-10 MED ORDER — METHOCARBAMOL 500 MG PO TABS: 500.0000 mg | ORAL_TABLET | Freq: Four times a day (QID) | ORAL | Status: AC | PRN

## 2019-07-10 MED ORDER — PHENYLEPHRINE 40 MCG/ML (10ML) SYRINGE FOR IV PUSH (FOR BLOOD PRESSURE SUPPORT)
PREFILLED_SYRINGE | INTRAVENOUS | Status: DC | PRN
  Administered 2019-07-10: 80 ug via INTRAVENOUS
  Administered 2019-07-10: 120 ug via INTRAVENOUS
  Administered 2019-07-10: 80 ug via INTRAVENOUS

## 2019-07-10 MED ORDER — ONDANSETRON HCL 4 MG/2ML IJ SOLN: 4.0000 mg | Freq: Four times a day (QID) | INTRAMUSCULAR | Status: DC | PRN

## 2019-07-10 MED ORDER — OXYCODONE-ACETAMINOPHEN 5-325 MG PO TABS: 1.0000 | ORAL_TABLET | ORAL | 0 refills | Status: AC | PRN

## 2019-07-10 MED ORDER — THROMBIN 20000 UNITS EX SOLR
CUTANEOUS | Status: DC | PRN
  Administered 2019-07-10: 20 mL via TOPICAL

## 2019-07-10 MED ORDER — ROCURONIUM BROMIDE 10 MG/ML (PF) SYRINGE
PREFILLED_SYRINGE | INTRAVENOUS | Status: DC | PRN
  Administered 2019-07-10: 60 mg via INTRAVENOUS
  Administered 2019-07-10: 20 mg via INTRAVENOUS

## 2019-07-10 MED ORDER — OXYCODONE HCL 5 MG PO TABS
ORAL_TABLET | ORAL | Status: AC
  Filled 2019-07-10: qty 1

## 2019-07-10 MED ORDER — METHYLENE BLUE 0.5 % INJ SOLN
INTRAVENOUS | Status: AC
  Filled 2019-07-10: qty 10

## 2019-07-10 MED ORDER — ROCURONIUM BROMIDE 10 MG/ML (PF) SYRINGE
PREFILLED_SYRINGE | INTRAVENOUS | Status: AC
  Filled 2019-07-10: qty 10

## 2019-07-10 MED ORDER — DEXAMETHASONE SODIUM PHOSPHATE 10 MG/ML IJ SOLN
INTRAMUSCULAR | Status: DC | PRN
  Administered 2019-07-10: 10 mg via INTRAVENOUS

## 2019-07-10 MED ORDER — ONDANSETRON HCL 4 MG/2ML IJ SOLN
INTRAMUSCULAR | Status: DC | PRN
  Administered 2019-07-10: 4 mg via INTRAVENOUS

## 2019-07-10 MED ORDER — PROPOFOL 10 MG/ML IV BOLUS
INTRAVENOUS | Status: AC
  Filled 2019-07-10: qty 20

## 2019-07-10 MED ORDER — PHENYLEPHRINE 40 MCG/ML (10ML) SYRINGE FOR IV PUSH (FOR BLOOD PRESSURE SUPPORT)
PREFILLED_SYRINGE | INTRAVENOUS | Status: AC
  Filled 2019-07-10: qty 10

## 2019-07-10 MED ORDER — METHYLPREDNISOLONE ACETATE 40 MG/ML IJ SUSP
INTRAMUSCULAR | Status: DC | PRN
  Administered 2019-07-10: 40 mg

## 2019-07-10 MED ORDER — OXYCODONE HCL 5 MG/5ML PO SOLN: 5.0000 mg | Freq: Once | ORAL | Status: AC | PRN

## 2019-07-10 MED ORDER — LIDOCAINE 2% (20 MG/ML) 5 ML SYRINGE
INTRAMUSCULAR | Status: DC | PRN
  Administered 2019-07-10: 60 mg via INTRAVENOUS

## 2019-07-10 MED ORDER — ALBUMIN HUMAN 5 % IV SOLN: INTRAVENOUS | Status: DC | PRN

## 2019-07-10 MED ORDER — METHYLPREDNISOLONE ACETATE 40 MG/ML IJ SUSP
INTRAMUSCULAR | Status: AC
  Filled 2019-07-10: qty 1

## 2019-07-10 MED ORDER — POVIDONE-IODINE 7.5 % EX SOLN
Freq: Once | CUTANEOUS | Status: DC
  Filled 2019-07-10: qty 118

## 2019-07-10 MED ORDER — FENTANYL CITRATE (PF) 100 MCG/2ML IJ SOLN
INTRAMUSCULAR | Status: AC
  Filled 2019-07-10: qty 2

## 2019-07-10 MED ORDER — FENTANYL CITRATE (PF) 100 MCG/2ML IJ SOLN
25.0000 ug | INTRAMUSCULAR | Status: DC | PRN
  Administered 2019-07-10 (×2): 50 ug via INTRAVENOUS

## 2019-07-10 MED ORDER — CEFAZOLIN SODIUM-DEXTROSE 2-4 GM/100ML-% IV SOLN
2.0000 g | INTRAVENOUS | Status: AC
  Administered 2019-07-10: 13:00:00 2 g via INTRAVENOUS
  Filled 2019-07-10: qty 100

## 2019-07-10 SURGICAL SUPPLY — 75 items
AGENT HMST KT MTR STRL THRMB (HEMOSTASIS)
APL SKNCLS STERI-STRIP NONHPOA (GAUZE/BANDAGES/DRESSINGS) ×1
BENZOIN TINCTURE PRP APPL 2/3 (GAUZE/BANDAGES/DRESSINGS) ×2 IMPLANT
BUR PRECISION FLUTE 5.0 (BURR) ×3 IMPLANT
CABLE BIPOLOR RESECTION CORD (MISCELLANEOUS) ×3 IMPLANT
CANISTER SUCT 3000ML PPV (MISCELLANEOUS) ×3 IMPLANT
CARTRIDGE OIL MAESTRO DRILL (MISCELLANEOUS) ×1 IMPLANT
CLOSURE WOUND 1/2 X4 (GAUZE/BANDAGES/DRESSINGS) ×1
COVER SURGICAL LIGHT HANDLE (MISCELLANEOUS) ×3 IMPLANT
COVER WAND RF STERILE (DRAPES) ×1 IMPLANT
DIFFUSER DRILL AIR PNEUMATIC (MISCELLANEOUS) ×3 IMPLANT
DRAIN CHANNEL 15F RND FF W/TCR (WOUND CARE) IMPLANT
DRAPE POUCH INSTRU U-SHP 10X18 (DRAPES) ×6 IMPLANT
DRAPE SURG 17X23 STRL (DRAPES) ×12 IMPLANT
DURAPREP 26ML APPLICATOR (WOUND CARE) ×3 IMPLANT
ELECT BLADE 4.0 EZ CLEAN MEGAD (MISCELLANEOUS) ×3
ELECT CAUTERY BLADE 6.4 (BLADE) ×3 IMPLANT
ELECT REM PT RETURN 9FT ADLT (ELECTROSURGICAL) ×3
ELECTRODE BLDE 4.0 EZ CLN MEGD (MISCELLANEOUS) ×1 IMPLANT
ELECTRODE REM PT RTRN 9FT ADLT (ELECTROSURGICAL) ×1 IMPLANT
EVACUATOR SILICONE 100CC (DRAIN) IMPLANT
FILTER STRAW FLUID ASPIR (MISCELLANEOUS) ×3 IMPLANT
GAUZE 4X4 16PLY RFD (DISPOSABLE) ×4 IMPLANT
GAUZE SPONGE 4X4 12PLY STRL (GAUZE/BANDAGES/DRESSINGS) ×3 IMPLANT
GLOVE BIO SURGEON STRL SZ7 (GLOVE) ×3 IMPLANT
GLOVE BIO SURGEON STRL SZ8 (GLOVE) ×3 IMPLANT
GLOVE BIOGEL PI IND STRL 7.0 (GLOVE) ×1 IMPLANT
GLOVE BIOGEL PI IND STRL 8 (GLOVE) ×1 IMPLANT
GLOVE BIOGEL PI INDICATOR 7.0 (GLOVE) ×2
GLOVE BIOGEL PI INDICATOR 8 (GLOVE) ×2
GOWN STRL REUS W/ TWL LRG LVL3 (GOWN DISPOSABLE) ×1 IMPLANT
GOWN STRL REUS W/ TWL XL LVL3 (GOWN DISPOSABLE) ×2 IMPLANT
GOWN STRL REUS W/TWL LRG LVL3 (GOWN DISPOSABLE) ×3
GOWN STRL REUS W/TWL XL LVL3 (GOWN DISPOSABLE) ×6
IV CATH 14GX2 1/4 (CATHETERS) ×3 IMPLANT
KIT BASIN OR (CUSTOM PROCEDURE TRAY) ×3 IMPLANT
KIT POSITION SURG JACKSON T1 (MISCELLANEOUS) ×3 IMPLANT
KIT TURNOVER KIT B (KITS) ×3 IMPLANT
NDL 18GX1X1/2 (RX/OR ONLY) (NEEDLE) ×1 IMPLANT
NDL HYPO 25GX1X1/2 BEV (NEEDLE) ×1 IMPLANT
NDL SPNL 18GX3.5 QUINCKE PK (NEEDLE) ×2 IMPLANT
NEEDLE 18GX1X1/2 (RX/OR ONLY) (NEEDLE) ×3 IMPLANT
NEEDLE 22X1 1/2 (OR ONLY) (NEEDLE) ×3 IMPLANT
NEEDLE HYPO 25GX1X1/2 BEV (NEEDLE) ×3 IMPLANT
NEEDLE SPNL 18GX3.5 QUINCKE PK (NEEDLE) ×6 IMPLANT
NS IRRIG 1000ML POUR BTL (IV SOLUTION) ×3 IMPLANT
OIL CARTRIDGE MAESTRO DRILL (MISCELLANEOUS) ×3
PACK LAMINECTOMY ORTHO (CUSTOM PROCEDURE TRAY) ×3 IMPLANT
PACK UNIVERSAL I (CUSTOM PROCEDURE TRAY) ×3 IMPLANT
PAD ARMBOARD 7.5X6 YLW CONV (MISCELLANEOUS) ×6 IMPLANT
PATTIES SURGICAL .5 X.5 (GAUZE/BANDAGES/DRESSINGS) ×2 IMPLANT
PATTIES SURGICAL .5 X1 (DISPOSABLE) ×1 IMPLANT
SPONGE INTESTINAL PEANUT (DISPOSABLE) ×3 IMPLANT
SPONGE SURGIFOAM ABS GEL 100 (HEMOSTASIS) ×3 IMPLANT
SPONGE SURGIFOAM ABS GEL SZ50 (HEMOSTASIS) ×1 IMPLANT
STRIP CLOSURE SKIN 1/2X4 (GAUZE/BANDAGES/DRESSINGS) ×1 IMPLANT
SURGIFLO W/THROMBIN 8M KIT (HEMOSTASIS) IMPLANT
SUT MNCRL AB 4-0 PS2 18 (SUTURE) ×3 IMPLANT
SUT VIC AB 0 CT1 18XCR BRD 8 (SUTURE) IMPLANT
SUT VIC AB 0 CT1 27 (SUTURE)
SUT VIC AB 0 CT1 27XBRD ANBCTR (SUTURE) IMPLANT
SUT VIC AB 0 CT1 8-18 (SUTURE)
SUT VIC AB 1 CT1 18XCR BRD 8 (SUTURE) ×1 IMPLANT
SUT VIC AB 1 CT1 8-18 (SUTURE) ×6
SUT VIC AB 2-0 CT2 18 VCP726D (SUTURE) ×3 IMPLANT
SYR 20ML LL LF (SYRINGE) ×3 IMPLANT
SYR BULB IRRIGATION 50ML (SYRINGE) ×3 IMPLANT
SYR CONTROL 10ML LL (SYRINGE) ×6 IMPLANT
SYR TB 1ML 25GX5/8 (SYRINGE) ×6 IMPLANT
SYR TB 1ML LUER SLIP (SYRINGE) ×6 IMPLANT
TAPE CLOTH SURG 4X10 WHT LF (GAUZE/BANDAGES/DRESSINGS) ×2 IMPLANT
TOWEL GREEN STERILE (TOWEL DISPOSABLE) ×3 IMPLANT
TOWEL GREEN STERILE FF (TOWEL DISPOSABLE) ×3 IMPLANT
WATER STERILE IRR 1000ML POUR (IV SOLUTION) ×3 IMPLANT
YANKAUER SUCT BULB TIP NO VENT (SUCTIONS) ×3 IMPLANT

## 2019-07-10 NOTE — Anesthesia Postprocedure Evaluation (Signed)
Anesthesia Post Note  Patient: Ruben Lopez  Procedure(s) Performed: LUMBAR 5- SACRUM 1 DECOMPRESSION (N/A )     Patient location during evaluation: PACU Anesthesia Type: General Level of consciousness: awake and alert Pain management: pain level controlled Vital Signs Assessment: post-procedure vital signs reviewed and stable Respiratory status: spontaneous breathing, nonlabored ventilation, respiratory function stable and patient connected to nasal cannula oxygen Cardiovascular status: blood pressure returned to baseline and stable Postop Assessment: no apparent nausea or vomiting Anesthetic complications: no    Last Vitals:  Vitals:   07/10/19 1535 07/10/19 1550  BP: 121/84 121/87  Pulse: 98 99  Resp: 12 13  Temp:  36.7 C  SpO2: 94% 93%    Last Pain:  Vitals:   07/10/19 1544  TempSrc:   PainSc: 4                  Jizel Cheeks COKER

## 2019-07-10 NOTE — Anesthesia Procedure Notes (Signed)
Procedure Name: Intubation Date/Time: 07/10/2019 12:26 PM Performed by: Lovie Chol, CRNA Pre-anesthesia Checklist: Patient identified, Emergency Drugs available, Suction available and Patient being monitored Patient Re-evaluated:Patient Re-evaluated prior to induction Oxygen Delivery Method: Circle System Utilized Preoxygenation: Pre-oxygenation with 100% oxygen Induction Type: IV induction Ventilation: Mask ventilation without difficulty Laryngoscope Size: Miller and 3 Grade View: Grade I Tube type: Oral Tube size: 7.5 mm Number of attempts: 1 Airway Equipment and Method: Stylet Placement Confirmation: ETT inserted through vocal cords under direct vision,  positive ETCO2 and breath sounds checked- equal and bilateral Secured at: 22 cm Tube secured with: Tape Dental Injury: Teeth and Oropharynx as per pre-operative assessment

## 2019-07-10 NOTE — H&P (Addendum)
PREOPERATIVE H&P  Chief Complaint: Left leg pain  HPI: Ruben Lopez is a 64 y.o. male who presents with ongoing pain in the left leg  MRI reveals a large left L5/S1 facet cyst, severely compressing the left S1 nerve  Patient has failed multiple forms of conservative care and continues to have pain (see office notes for additional details regarding the patient's full course of treatment)  Past Medical History:  Diagnosis Date  . Complication of anesthesia    slow to wake  . GERD (gastroesophageal reflux disease)   . Headache(784.0)    hx  . Hypertension    Past Surgical History:  Procedure Laterality Date  . APPENDECTOMY    . BALLOON DILATION  06/06/2011   Procedure: BALLOON DILATION;  Surgeon: Petra Kuba, MD;  Location: [facility];  Service: Endoscopy;  Laterality: N/A;  . COLON SURGERY    . ELBOW FRACTURE SURGERY    . ESOPHAGOGASTRODUODENOSCOPY  06/06/2011   Procedure: ESOPHAGOGASTRODUODENOSCOPY (EGD);  Surgeon: Petra Kuba, MD;  Location: [facility];  Service: Endoscopy;  Laterality: N/A;  . HAND / FINGER LESION EXCISION     ganglion cyst  . HAND NERVE REPAIR     from injury  . HERNIA REPAIR     Social History   Socioeconomic History  . Marital status: Married    Spouse name: Not on file  . Number of children: Not on file  . Years of education: Not on file  . Highest education level: Not on file  Occupational History  . Not on file  Tobacco Use  . Smoking status: Former Smoker    Types: Cigarettes  . Smokeless tobacco: Never Used  Substance and Sexual Activity  . Alcohol use: Yes    Alcohol/week: 8.0 standard drinks    Types: 1 Cans of beer, 7 Glasses of wine per week    Comment: moderate 1 glass wine/daily  . Drug use: Never  . Sexual activity: Not on file  Other Topics Concern  . Not on file  Social History Narrative  . Not on file   Social Determinants of Health   Financial Resource Strain:   . Difficulty of Paying Living Expenses:  Not on file  Food Insecurity:   . Worried About Programme researcher, broadcasting/film/video in the Last Year: Not on file  . Ran Out of Food in the Last Year: Not on file  Transportation Needs:   . Lack of Transportation (Medical): Not on file  . Lack of Transportation (Non-Medical): Not on file  Physical Activity:   . Days of Exercise per Week: Not on file  . Minutes of Exercise per Session: Not on file  Stress:   . Feeling of Stress : Not on file  Social Connections:   . Frequency of Communication with Friends and Family: Not on file  . Frequency of Social Gatherings with Friends and Family: Not on file  . Attends Religious Services: Not on file  . Active Member of Clubs or Organizations: Not on file  . Attends Banker Meetings: Not on file  . Marital Status: Not on file   Family History  Problem Relation Age of Onset  . Anesthesia problems Neg Hx   . Hypotension Neg Hx   . Malignant hyperthermia Neg Hx   . Pseudochol deficiency Neg Hx    No Known Allergies Prior to Admission medications   Medication Sig Start Date End Date Taking? Authorizing Provider  allopurinol (ZYLOPRIM) 300 MG  tablet Take 300 mg by mouth at bedtime.    Yes [provider]  amLODipine (NORVASC) 10 MG tablet Take 10 mg by mouth at bedtime. 04/18/19  Yes [provider]  cyclobenzaprine (FLEXERIL) 5 MG tablet Take 5-10 mg by mouth at bedtime. 06/12/19  Yes [provider]  FLOVENT HFA 220 MCG/ACT inhaler Inhale 1 puff into the lungs daily as needed (GERD).  06/13/19  Yes [provider]  gabapentin (NEURONTIN) 300 MG capsule Take 300 mg by mouth at bedtime. 06/12/19  Yes [provider]  hydrochlorothiazide (HYDRODIURIL) 12.5 MG tablet Take 12.5 mg by mouth at bedtime. 05/16/19  Yes [provider]  omeprazole (PRILOSEC) 10 MG capsule Take 10 mg by mouth at bedtime. 06/13/19  Yes [provider]  traMADol (ULTRAM) 50 MG tablet Take 50 mg by mouth daily as  needed. 06/12/19  Yes [provider]     All other systems have been reviewed and were otherwise negative with the exception of those mentioned in the HPI and as above.  Physical Exam: Vitals:   07/10/19 0854  BP: (!) 149/72  Pulse: 99  Resp: 18  Temp: 98.2 F (36.8 C)  SpO2: 98%    Body mass index is 26.88 kg/m.  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

## 2019-07-10 NOTE — Op Note (Signed)
PATIENT NAME: Ruben Lopez   MEDICAL RECORD NO.:   [id number]   DATE OF BIRTH: [birth year]   DATE OF PROCEDURE: 07/10/2019                               OPERATIVE REPORT   PREOPERATIVE DIAGNOSES: 1.  Left S1 radiculopathy 2.  Large left L5-S1 facet cyst, severely compressing the left S1 nerve  POSTOPERATIVE DIAGNOSES: 1.  Left S1 radiculopathy 2.  Large left L5-S1 facet cyst, severely compressing the left S1 nerve  PROCEDURE:  L5/S1 laminectomy with bilateral partial facetectomy and Removal of large left L5-S1 facet cyst  SURGEON:  Estill Bamberg, MD.  ASSISTANTJason Coop, PA-C.  ANESTHESIA:  General endotracheal anesthesia.  COMPLICATIONS:  None.  DISPOSITION:  Stable.  ESTIMATED BLOOD LOSS:  Minimal.  INDICATIONS FOR SURGERY:  Briefly, Mr. Ciolek is a very pleasant 64- year-old male, who did present to me with pain in the left leg. The patient's MRI did reveal a large left L5-S1 facet cyst, severely compressing the left S1 nerve.  We did proceed with appropriate conservative treatment, but the patient did continue to have ongoing pain, which he did feel was limiting his function substantially.  Given the patient's ongoing pain and dysfunction, we did discuss proceeding with the procedure reflected above.  The patient was fully aware of the risks and limitations of surgery and did wish to proceed.  OPERATIVE DETAILS:  On 07/10/2019, the patient was brought to surgery and general endotracheal anesthesia was administered.  The patient was placed prone on a well-padded flat Jackson bed with a Wilson frame. Antibiotics were given and the back was prepped and draped in the usual sterile fashion.  A time-out procedure was performed.  I then made a midline incision overlying the L5-S1 intervertebral space.  The fascia was incised at the midline.  The paraspinal musculature was bluntly retracted laterally and held retracted with a self-retaining retractor. After  confirming the appropriate operative level, I did remove the spinous process of L5.  At this point, I proceeded with a partial facetectomy on the right and left sides.  A liberal left-sided L5-S1 facetectomy was performed, and in doing so, readily noted was a prominent left-sided L5-S1 facet cyst.  Of note, the cyst was entirely adherent to the left side of the thecal sac and the traversing left S1 nerve.  This was causing very severe spinal stenosis and very significant compression of the left S1 nerve.  I did meticulously make many attempts at developing a plane between the medial aspect of the cyst in the lateral aspect of the thecal sac.  I was not able to safely develop a plane.  I therefore did enter the lateral aspect of the facet cyst, well lateral to the dura and traversing S1 nerve.  I did use a nerve hook to enter into the cyst, after which point I did place downward pressure onto the cyst, and in doing so, I was able to entirely and thoroughly decompress the cyst.  In doing so, the compression of the thecal sac and traversing S1 nerve was entirely alleviated.  I was very pleased with the decompression that I was able to accomplish.  The remnant of the wall of the cyst medially did remain, as it was entirely adherent to the neurologic structures, however, it was not compressive. All bleeding was then adequately controlled.  At this point, 40 mg of Depo-Medrol was  introduced about the epidural space.  Prior to this, the wound was copiously irrigated with a total of approximately 2 L of normal saline. Gelfoam was placed over the laminectomy site.  I was very pleased with the decompression.  There was no extravasation of cerebrospinal fluid noted throughout the entire surgery.  At this point, the wound was closed in layers using #1 Vicryl, followed by 2-0 Vicryl, followed by 4- 0 Monocryl.  Benzoin and Steri-Strips were applied, followed by a sterile dressing.  All instrument counts were correct  at the termination of the procedure.  Of note, Pricilla Holm, PA-C, was my assistant throughout surgery, and did aid in retraction, suctioning, and closure from start to finish.   Phylliss Bob, MD

## 2019-07-10 NOTE — Transfer of Care (Signed)
Immediate Anesthesia Transfer of Care Note  Patient: Ruben Lopez  Procedure(s) Performed: LUMBAR 5- SACRUM 1 DECOMPRESSION (N/A )  Patient Location: PACU  Anesthesia Type:General  Level of Consciousness: awake, oriented and drowsy  Airway & Oxygen Therapy: Patient Spontanous Breathing and Patient connected to face mask oxygen  Post-op Assessment: Report given to RN and Post -op Vital signs reviewed and stable  Post vital signs: Reviewed and stable  Last Vitals:  Vitals Value Taken Time  BP 124/78 07/10/19 1505  Temp 36.7 C 07/10/19 1505  Pulse 93 07/10/19 1511  Resp 12 07/10/19 1511  SpO2 98 % 07/10/19 1511  Vitals shown include unvalidated device data.  Last Pain:  Vitals:   07/10/19 1505  TempSrc:   PainSc: 7       Patients Stated Pain Goal: 3 (07/10/19 0920)  Complications: No apparent anesthesia complications

## 2019-07-10 NOTE — Anesthesia Preprocedure Evaluation (Addendum)
Anesthesia Evaluation  Patient identified by MRN, date of birth, ID band Patient awake    Reviewed: Allergy & Precautions, H&P , NPO status , Patient's Chart, lab work & pertinent test results  Airway Mallampati: II  TM Distance: >3 FB Neck ROM: full    Dental  (+) Dental Advisory Given, Chipped,    Pulmonary former smoker,    breath sounds clear to auscultation       Cardiovascular hypertension, Pt. on medications  Rhythm:regular Rate:Normal     Neuro/Psych  Headaches,    GI/Hepatic GERD  Medicated and Controlled,  Endo/Other    Renal/GU      Musculoskeletal   Abdominal   Peds  Hematology   Anesthesia Other Findings   Reproductive/Obstetrics                            Anesthesia Physical Anesthesia Plan  ASA: II  Anesthesia Plan: General   Post-op Pain Management:    Induction: Intravenous  PONV Risk Score and Plan: 2 and Ondansetron, Dexamethasone, Midazolam and Treatment may vary due to age or medical condition  Airway Management Planned: Oral ETT  Additional Equipment:   Intra-op Plan:   Post-operative Plan: Extubation in OR  Informed Consent: I have reviewed the patients History and Physical, chart, labs and discussed the procedure including the risks, benefits and alternatives for the proposed anesthesia with the patient or authorized representative who has indicated his/her understanding and acceptance.       Plan Discussed with: CRNA, Anesthesiologist and Surgeon  Anesthesia Plan Comments:         Anesthesia Quick Evaluation

## 2019-07-11 MED FILL — Thrombin For Soln Kit 20000 Unit: CUTANEOUS | Qty: 1 | Status: AC

## 2020-12-01 DEATH — deceased

## 2021-11-14 IMAGING — CT CT BIOPSY
3 of 9 series · 8 of 32 positions shown, 13 images · non-contrast
Comparison: none

CLINICAL DATA: Lumbosacral spondylosis without myelopathy with
radiculopathy. Sustained improvement of right lower extremity pain
following right L4-5 facet joint aspiration and steroid injection in
[DATE]. New left lower extremity pain with a new left L5-S1
synovial cyst on MRI.

[Series 2: needle -guided injection · axial · 0.71mm/px · z∈[-86,-44]mm · 4 of 35 slices shown, 9 images (1 of 3)]
[im 7/35  soft-tissue]
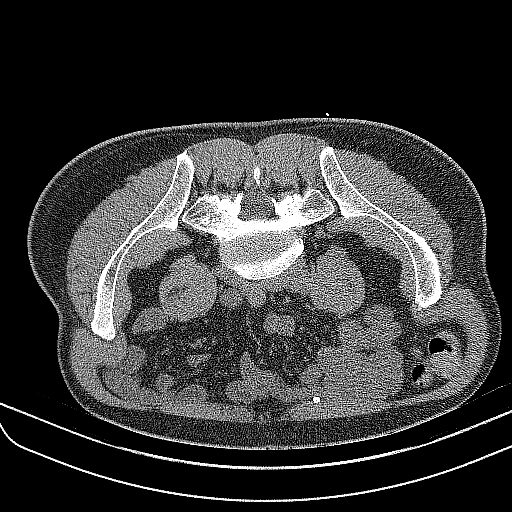
[im 7/35  lung]
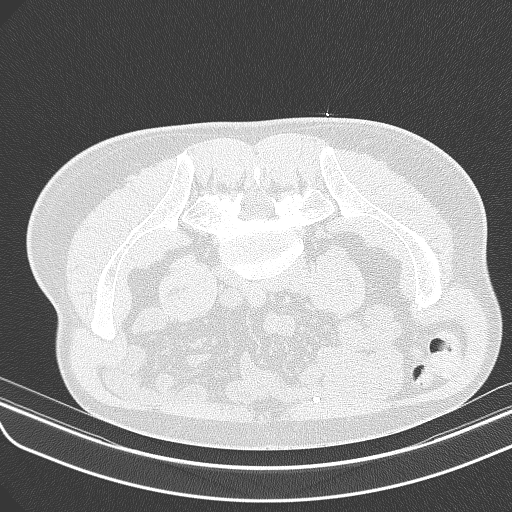
[im 7/35  bone]
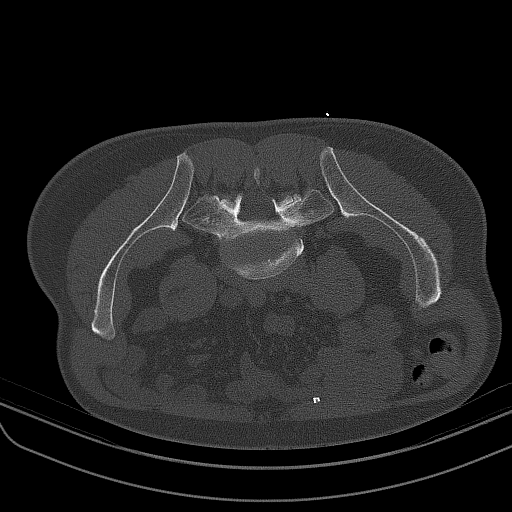
[im 14/35  soft-tissue]
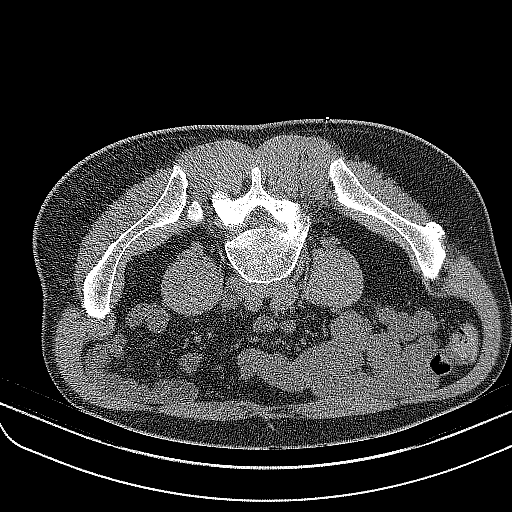
[im 14/35  lung]
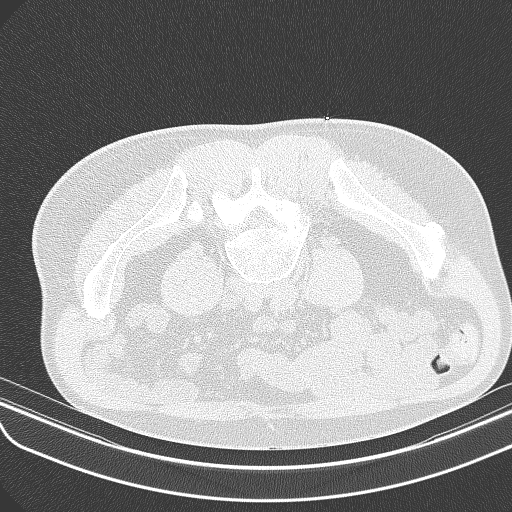
[im 21/35  soft-tissue]
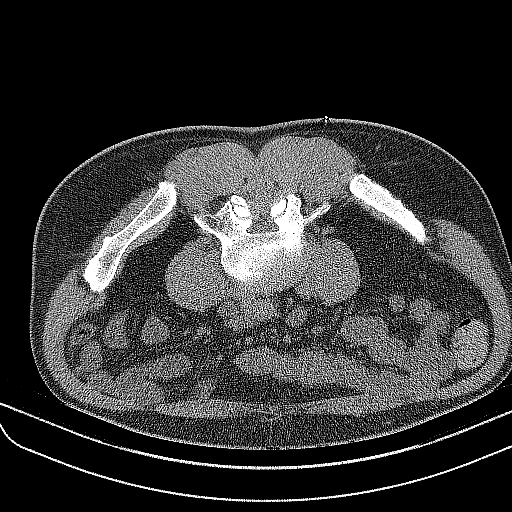
[im 21/35  lung]
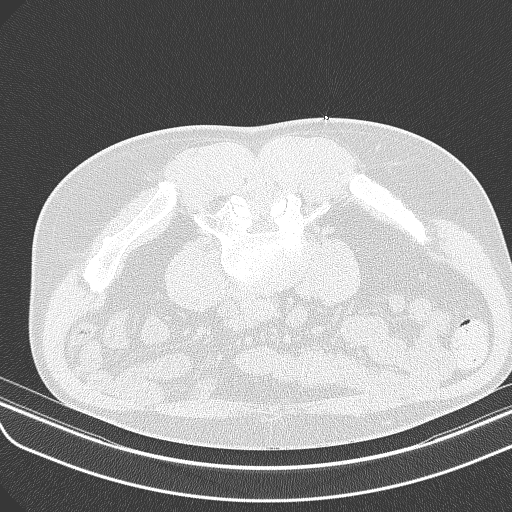
[im 28/35  soft-tissue]
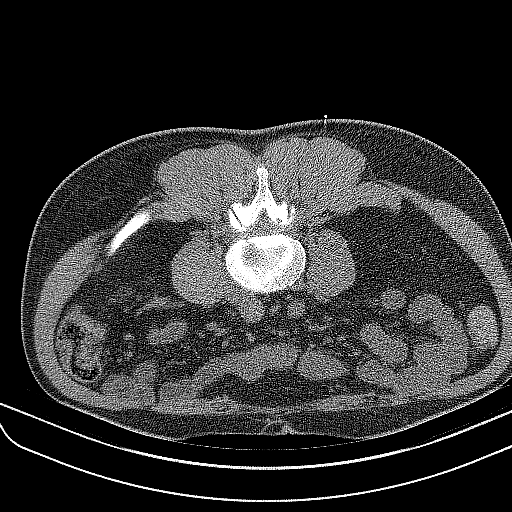
[im 28/35  lung]
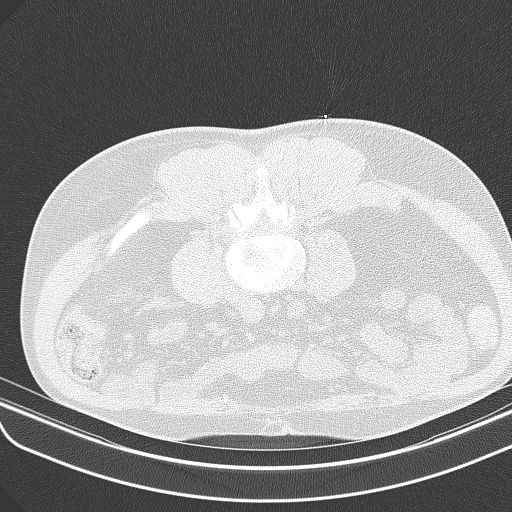

[Series 9: needle -guided injection · axial · 0.71mm/px · z∈[-82,-68]mm · 2 of 22 slices shown (2 of 3)]
[im 8/22  soft-tissue]
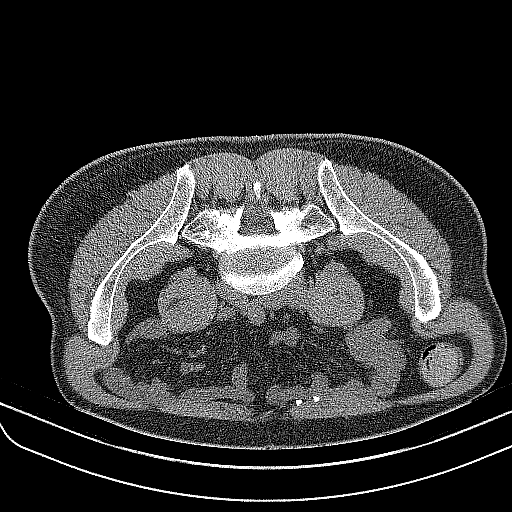
[im 15/22  soft-tissue]
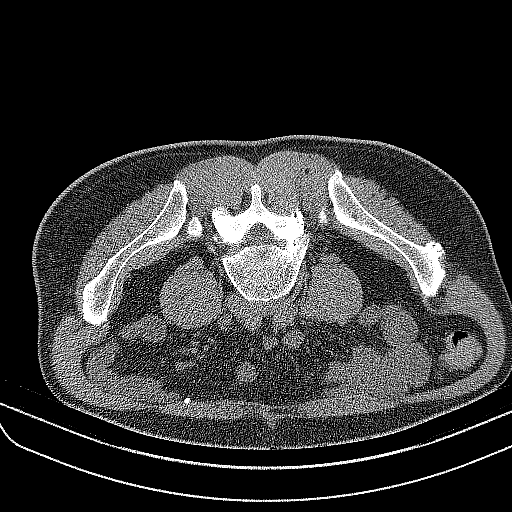

[Series 10: needle -guided injection · axial · 0.71mm/px · z∈[-82,-68]mm · 2 of 22 slices shown (3 of 3)]
[im 8/22  soft-tissue]
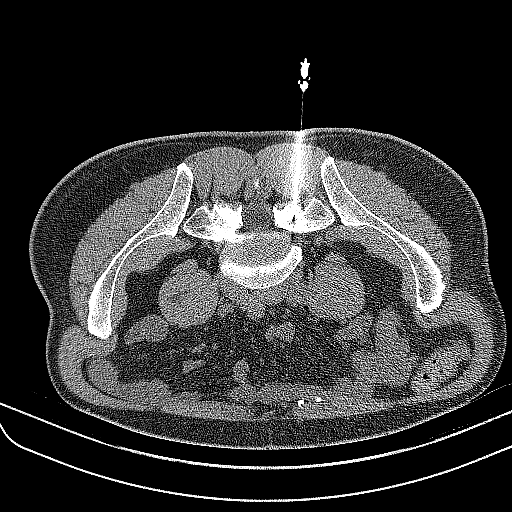
[im 15/22  soft-tissue]
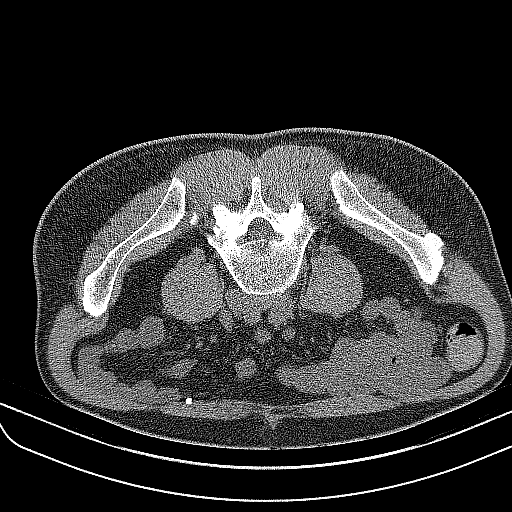

[8 of 32 positions shown; findings below may reference images not displayed]

EXAM:
CT GUIDED LEFT L5-S1 FACET JOINT ASPIRATION AND STEROID INJECTION

PROCEDURE:
After a thorough discussion of risks and benefits of the procedure,
including bleeding, infection, injury to nerves, blood vessels, and
adjacent structures, verbal and written consent was obtained. The
patient was placed prone on the CT table and localization was
performed over the sacrum. Target site marked using CT guidance. The
skin was prepped and draped in the usual sterile fashion using
Betadine soap.

After local anesthesia with 1% lidocaine without epinephrine and
subsequent deep anesthesia, a 3.5 inch 20 gauge spinal needle was
advanced into the left L5-S1 facet joint under intermittent CT
guidance.

Injection of less than 0.5 mL of Isovue M 200 demonstrated
intra-articular and periarticular contrast spread without
opacification of the known synovial cyst located medial to the
joint. A small amount of additional contrast was injected with
further opacification of the joint but without visualization of the
cyst. Aspiration was attempted both before and after contrast
injection without any significant amount of fluid able to be
aspirated. Subsequently, 40 mg of Depo-Medrol were injected into the
facet joint. The needle was removed and a sterile dressing applied.

No complications were observed.
IMPRESSION: Successful CT-guided left L5-S1 facet joint steroid injection.

Intra-articular contrast injection did not opacify the intraspinal
synovial cyst shown on MRI, and no significant fluid could be
aspirated from the joint.
